# Patient Record
Sex: Female | Born: 2000 | Race: White | Hispanic: No | Marital: Single
Health system: Southern US, Community
[De-identification: ages and names within clinical notes are randomized; demographics above are authoritative.]

## PROBLEM LIST (undated history)

## (undated) DIAGNOSIS — R109 Unspecified abdominal pain: Secondary | ICD-10-CM

## (undated) DIAGNOSIS — K5900 Constipation, unspecified: Secondary | ICD-10-CM

## (undated) HISTORY — PX: MOUTH SURGERY: SHX715

## (undated) HISTORY — DX: Constipation, unspecified: K59.00

## (undated) HISTORY — DX: Unspecified abdominal pain: R10.9

---
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
## 2021-12-27 ENCOUNTER — Ambulatory Visit (HOSPITAL_BASED_OUTPATIENT_CLINIC_OR_DEPARTMENT_OTHER): Payer: Self-pay | Admitting: Nurse Practitioner

## 2022-01-30 ENCOUNTER — Ambulatory Visit (HOSPITAL_BASED_OUTPATIENT_CLINIC_OR_DEPARTMENT_OTHER): Payer: Self-pay | Admitting: Nurse Practitioner

## 2022-02-06 ENCOUNTER — Ambulatory Visit (INDEPENDENT_AMBULATORY_CARE_PROVIDER_SITE_OTHER): Payer: BC Managed Care – PPO | Admitting: Podiatry

## 2022-02-06 ENCOUNTER — Ambulatory Visit: Payer: BC Managed Care – PPO | Admitting: Podiatry

## 2022-02-06 DIAGNOSIS — L600 Ingrowing nail: Secondary | ICD-10-CM | POA: Diagnosis not present

## 2022-02-06 NOTE — Progress Notes (Signed)
   Chief Complaint  Patient presents with   Ingrown Toenail    Bilateral ingrown toe nails    Subjective: Patient presents today with her longtime boyfriend  for evaluation of pain to the medial and lateral border of the bilateral great toes. Patient is concerned for possible ingrown nail.  It is very sensitive to touch.  This has been present for several years.  She recently went to [facility] and they attempted to treat her.  She says that when she was injected with a lidocaine and anesthesia she was highly anxious and the physician asked her to leave.  She has a lot of anxiety today coming in to have her ingrown toenails evaluated.  Patient presents today for further treatment and evaluation.  Past Medical History:  Diagnosis Date   Abdominal pain    Constipation    No past surgical history on file. No Known Allergies   Objective:  General: Well developed, nourished, in no acute distress, alert and oriented x3   Dermatology: Skin is warm, dry and supple bilateral.  Medial and lateral border of the bilateral great toes appears to be erythematous with evidence of an ingrowing nail. Pain on palpation noted to the border of the nail fold. The remaining nails appear unremarkable at this time. There are no open sores, lesions.  Vascular: Dorsalis Pedis artery and Posterior Tibial artery pedal pulses palpable. No lower extremity edema noted.   Neruologic: Grossly intact via light touch bilateral.  Musculoskeletal: Muscular strength within normal limits in all groups bilateral. Normal range of motion noted to all pedal and ankle joints.   Assesement: #1 Paronychia with ingrowing nail medial and lateral border bilateral great toes #2 Pain in toe  Plan of Care:  1. Patient evaluated.  2. Discussed treatment alternatives and plan of care. Explained nail avulsion procedure and post procedure course to patient. 3. Patient opted for permanent partial nail avulsion of the ingrown  portion of the nail but she would like to have this performed at the surgery center due to high anxiety of the injections and traumatizing experience at [facility].  4.  Authorization for the procedure was initiated today to have it performed at the surgery center under sedation.  Risk benefits advantages and disadvantages were explained.  No guarantees were expressed or implied. 5.  Authorization for surgery was initiated today.  Surgery will consist partial nail matricectomy medial and lateral border of the bilateral great toes 6.  Return to clinic 1 week postop  *Chef at [employer]: Drink. Eat. Listen  Felecia Shelling, DPM [facility]  Dr. Felecia Shelling, DPM    [address] [place], [place] [address]                Office [phone]  Fax [phone]

## 2022-02-09 ENCOUNTER — Telehealth: Payer: Self-pay | Admitting: Urology

## 2022-02-09 NOTE — Telephone Encounter (Signed)
DOS - 02/16/22  PARTIAL NAIL MATRICECTOMY BILAT GREAT TOE --- 11750  [employer] EFFECTIVE DATE - 07/24/21  PLAN DEDUCTIBLE - $5,500.00 W/ $5,500.00 REMAINING OUT OF POCKET - $9,100.00 W/ $9,100.00 REMAINING COINSURANCE - 50% COPAY - $0.00   NO PRIOR AUTH REQUIRED.

## 2022-02-16 ENCOUNTER — Other Ambulatory Visit: Payer: Self-pay | Admitting: Podiatry

## 2022-02-16 DIAGNOSIS — L600 Ingrowing nail: Secondary | ICD-10-CM | POA: Diagnosis not present

## 2022-02-16 MED ORDER — DOXYCYCLINE HYCLATE 100 MG PO TABS: 100.0000 mg | ORAL_TABLET | Freq: Two times a day (BID) | ORAL | 0 refills | Status: AC

## 2022-02-16 MED ORDER — GENTAMICIN SULFATE 0.1 % EX CREA: 1.0000 | TOPICAL_CREAM | Freq: Two times a day (BID) | CUTANEOUS | 1 refills | Status: AC

## 2022-02-16 NOTE — Progress Notes (Signed)
PRN postop partial nail matrixectomy b/l hallux

## 2022-02-22 ENCOUNTER — Encounter: Payer: Self-pay | Admitting: Podiatry

## 2022-02-22 ENCOUNTER — Ambulatory Visit (INDEPENDENT_AMBULATORY_CARE_PROVIDER_SITE_OTHER): Payer: BC Managed Care – PPO | Admitting: Podiatry

## 2022-02-22 DIAGNOSIS — L600 Ingrowing nail: Secondary | ICD-10-CM

## 2022-02-22 NOTE — Progress Notes (Signed)
   Chief Complaint  Patient presents with   Follow-up    POV #1 DOS 02/16/22 --- PARIAL NAIL MATRICECTOMY MEDIAL AND LATERAL BORDERS BILAT GREAT TOES    Subjective: 21 y.o. female presents today status post permanent nail avulsion procedure of the medial and lateral border of the bilateral great toes that was performed on 02/16/2022 performed at the [facility] specialty surgery center.  Patient states that she is feeling well.  She remove the dressings 1 day after the procedure and has been soaking her feet and applying the antibiotic cream that was prescribed.  She presents for further treatment and evaluation.   Past Medical History:  Diagnosis Date   Abdominal pain    Constipation     Objective: Neurovascular status intact.  Skin is warm, dry and supple. Nail and respective nail fold appears to be healing appropriately.   Assessment: #1 s/p partial permanent nail matrixectomy medial and lateral border of the bilateral great toes   Plan of care: #1 patient was evaluated  #2 light debridement of the periungual debris was performed to the border of the respective toe and nail plate using a tissue nipper. #3 patient is to return to clinic on a PRN basis.   Felecia Shelling, DPM [facility]  Dr. Felecia Shelling, DPM    [address] [place], [place] [address]                Office [phone]  Fax [phone]

## 2022-03-01 ENCOUNTER — Encounter: Payer: BC Managed Care – PPO | Admitting: Podiatry

## 2022-03-15 ENCOUNTER — Encounter: Payer: Self-pay | Admitting: Podiatry

## 2022-03-31 ENCOUNTER — Ambulatory Visit (INDEPENDENT_AMBULATORY_CARE_PROVIDER_SITE_OTHER): Payer: BLUE CROSS/BLUE SHIELD | Admitting: Podiatry

## 2022-03-31 DIAGNOSIS — L600 Ingrowing nail: Secondary | ICD-10-CM

## 2022-03-31 NOTE — Progress Notes (Signed)
Subjective: Brooke Chaney is a 21 y.o.  female returns to office today for follow up evaluation after having bilateral Hallux Lateral border nail avulsion performed. Patient has been soaking using epsom salt and applying topical antibiotic covered with bandaid daily. Patient denies fevers, chills, nausea, vomiting. Denies any calf pain, chest pain, SOB.   Objective:  Vitals: Reviewed  General: Well developed, nourished, in no acute distress, alert and oriented x3   Dermatology: Skin is warm, dry and supple bilateral. Lateral hallux nail border appears to be clean, dry, with mild granular tissue and surrounding scab. There is no surrounding erythema, edema, drainage/purulence.  There are some granuloma noted to bilateral hallux border.  The remaining nails appear unremarkable at this time. There are no other lesions or other signs of infection present.  Neurovascular status: Intact. No lower extremity swelling; No pain with calf compression bilateral.  Musculoskeletal: Decreased tenderness to palpation of the Lateral hallux nail fold(s). Muscular strength within normal limits bilateral.   Assesement and Plan: S/p partial nail avulsion, doing well.   I encouraged her to do Betadine wet-to-dry dressing bilaterally.  There are some granuloma tissue.  If it continues to get worse we will plan on redoing the local anesthesia and removing the granuloma of the skin.  I discussed with patient she states understanding.  She is known to Dr. Logan Bores will follow back up with him  Nicholes Rough, DPM

## 2022-09-28 ENCOUNTER — Ambulatory Visit (INDEPENDENT_AMBULATORY_CARE_PROVIDER_SITE_OTHER): Payer: 59

## 2022-09-28 ENCOUNTER — Ambulatory Visit (INDEPENDENT_AMBULATORY_CARE_PROVIDER_SITE_OTHER): Payer: BLUE CROSS/BLUE SHIELD | Admitting: Orthopedic Surgery

## 2022-09-28 DIAGNOSIS — G8929 Other chronic pain: Secondary | ICD-10-CM

## 2022-09-28 DIAGNOSIS — M545 Low back pain, unspecified: Secondary | ICD-10-CM

## 2022-10-04 ENCOUNTER — Encounter: Payer: Self-pay | Admitting: Orthopedic Surgery

## 2022-10-04 NOTE — Progress Notes (Addendum)
   Office Visit Note   Patient: Brooke Chaney           Date of Birth: [birth year]           MRN: [id number] Visit Date: 09/28/2022              Requested by: No referring provider defined for this encounter. 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  She states that recently she has had some falls and 100 pound dog jumped on her back causing acute right lower back pain pain with standing patient states that she has right leg pain that goes down to the knee.  Patient states that her back pain is chronic 24 hours a day 7 days a week which she describes as a pain 4 out of 10.  Assessment & Plan: Visit Diagnoses:  1. Chronic bilateral low back pain without sciatica     Plan: Recommended strengthening and heat.  Follow-Up Instructions: Return if symptoms worsen or fail to improve.   Ortho Exam  Patient is alert, oriented, no adenopathy, well-dressed, normal affect, normal respiratory effort. Examination patient has a normal gait motor strength is 5/5 in all motor groups of both lower extremities.  There is no pain with range of motion of the hip knee or ankle she has a negative straight leg raise bilaterally.  Imaging: No results found. No images are attached to the encounter.  Labs: No results found for: "HGBA1C", "ESRSEDRATE", "CRP", "LABURIC", "REPTSTATUS", "GRAMSTAIN", "CULT", "LABORGA"   No results found for: "ALBUMIN", "PREALBUMIN", "CBC"  No results found for: "MG" No results found for: "VD25OH"  No results found for: "PREALBUMIN"     No data to display           There is no height or weight on file to calculate BMI.  Orders:  Orders Placed This Encounter  Procedures   XR Lumbar Spine 2-3 Views   No orders of the defined types were placed in this encounter.    Procedures: No procedures  performed  Clinical Data: No additional findings.  ROS:  All other systems negative, except as noted in the HPI. Review of Systems  Objective: Vital Signs: There were no vitals taken for this visit.  Specialty Comments:  No specialty comments available.  PMFS History: Patient Active Problem List   Diagnosis Date Noted   Simple constipation    Generalized abdominal pain    Past Medical History:  Diagnosis Date   Abdominal pain    Constipation     History reviewed. No pertinent family history.  History reviewed. No pertinent surgical history. Social History   Occupational History   Not on file  Tobacco Use   Smoking status: Never   Smokeless tobacco: Never  Substance and Sexual Activity   Alcohol use: Not on file   Drug use: Not on file   Sexual activity: Not on file

## 2022-10-04 NOTE — Addendum Note (Signed)
Addended by: Meridee Score on: 10/04/2022 05:00 PM   Modules accepted: Level of Service

## 2022-11-21 DIAGNOSIS — J209 Acute bronchitis, unspecified: Secondary | ICD-10-CM | POA: Diagnosis not present

## 2022-11-21 DIAGNOSIS — R059 Cough, unspecified: Secondary | ICD-10-CM | POA: Diagnosis not present

## 2022-11-21 DIAGNOSIS — J029 Acute pharyngitis, unspecified: Secondary | ICD-10-CM | POA: Diagnosis not present

## 2022-11-21 DIAGNOSIS — Z20822 Contact with and (suspected) exposure to covid-19: Secondary | ICD-10-CM | POA: Diagnosis not present

## 2022-11-24 DIAGNOSIS — J209 Acute bronchitis, unspecified: Secondary | ICD-10-CM | POA: Diagnosis not present

## 2022-12-28 DIAGNOSIS — M47816 Spondylosis without myelopathy or radiculopathy, lumbar region: Secondary | ICD-10-CM | POA: Diagnosis not present

## 2022-12-28 DIAGNOSIS — M41129 Adolescent idiopathic scoliosis, site unspecified: Secondary | ICD-10-CM | POA: Diagnosis not present

## 2023-01-11 DIAGNOSIS — N760 Acute vaginitis: Secondary | ICD-10-CM | POA: Diagnosis not present

## 2023-01-11 DIAGNOSIS — N939 Abnormal uterine and vaginal bleeding, unspecified: Secondary | ICD-10-CM | POA: Diagnosis not present

## 2023-01-11 DIAGNOSIS — N9089 Other specified noninflammatory disorders of vulva and perineum: Secondary | ICD-10-CM | POA: Diagnosis not present

## 2023-01-11 DIAGNOSIS — Z3202 Encounter for pregnancy test, result negative: Secondary | ICD-10-CM | POA: Diagnosis not present

## 2023-01-11 DIAGNOSIS — Z113 Encounter for screening for infections with a predominantly sexual mode of transmission: Secondary | ICD-10-CM | POA: Diagnosis not present

## 2023-04-24 ENCOUNTER — Other Ambulatory Visit: Payer: Self-pay | Admitting: Obstetrics & Gynecology

## 2023-04-24 DIAGNOSIS — N907 Vulvar cyst: Secondary | ICD-10-CM

## 2023-04-26 ENCOUNTER — Encounter: Payer: BLUE CROSS/BLUE SHIELD | Admitting: Nurse Practitioner

## 2023-05-08 ENCOUNTER — Other Ambulatory Visit: Payer: 59

## 2023-05-09 ENCOUNTER — Encounter (HOSPITAL_BASED_OUTPATIENT_CLINIC_OR_DEPARTMENT_OTHER): Payer: Self-pay | Admitting: Emergency Medicine

## 2023-05-09 ENCOUNTER — Emergency Department (HOSPITAL_BASED_OUTPATIENT_CLINIC_OR_DEPARTMENT_OTHER)
Admission: EM | Admit: 2023-05-09 | Discharge: 2023-05-10 | Disposition: A | Discharge status: Home / Self Care | Payer: BLUE CROSS/BLUE SHIELD | Attending: Emergency Medicine | Admitting: Emergency Medicine

## 2023-05-09 ENCOUNTER — Emergency Department (HOSPITAL_BASED_OUTPATIENT_CLINIC_OR_DEPARTMENT_OTHER): Payer: BLUE CROSS/BLUE SHIELD

## 2023-05-09 ENCOUNTER — Other Ambulatory Visit: Payer: Self-pay

## 2023-05-09 DIAGNOSIS — N751 Abscess of Bartholin's gland: Secondary | ICD-10-CM | POA: Insufficient documentation

## 2023-05-09 DIAGNOSIS — R102 Pelvic and perineal pain: Secondary | ICD-10-CM | POA: Diagnosis present

## 2023-05-09 DIAGNOSIS — N750 Cyst of Bartholin's gland: Secondary | ICD-10-CM

## 2023-05-09 LAB — BASIC METABOLIC PANEL
Anion gap: 9 (ref 5–15)
BUN: 9 mg/dL (ref 6–20)
CO2: 28 mmol/L (ref 22–32)
Calcium: 9.7 mg/dL (ref 8.9–10.3)
Chloride: 102 mmol/L (ref 98–111)
Creatinine, Ser: 0.65 mg/dL (ref 0.44–1.00)
GFR, Estimated: >60 mL/min (ref >60)
Glucose, Bld: 95 mg/dL (ref 70–99)
Potassium: 3.7 mmol/L (ref 3.5–5.1)
Sodium: 139 mmol/L (ref 135–145)

## 2023-05-09 LAB — HCG, QUANTITATIVE, PREGNANCY: hCG, Beta Chain, Quant, S: <1 m[IU]/mL (ref <5)

## 2023-05-09 LAB — CBC
HCT: 36.3 % (ref 36.0–46.0)
Hemoglobin: 12.2 g/dL (ref 12.0–15.0)
MCH: 29.4 pg (ref 26.0–34.0)
MCHC: 33.6 g/dL (ref 30.0–36.0)
MCV: 87.5 fL (ref 80.0–100.0)
Platelets: 359 10*3/uL (ref 150–400)
RBC: 4.15 MIL/uL (ref 3.87–5.11)
RDW: 14.6 % (ref 11.5–15.5)
WBC: 17.7 10*3/uL — ABNORMAL HIGH (ref 4.0–10.5)
nRBC: 0 % (ref 0.0–0.2)

## 2023-05-09 MED ORDER — OXYCODONE-ACETAMINOPHEN 5-325 MG PO TABS
1.0000 | ORAL_TABLET | ORAL | Status: DC | PRN
  Administered 2023-05-09: 1 via ORAL
  Filled 2023-05-09: qty 1

## 2023-05-09 MED ORDER — IOHEXOL 300 MG/ML  SOLN
100.0000 mL | Freq: Once | INTRAMUSCULAR | Status: AC | PRN
  Administered 2023-05-09: 80 mL via INTRAVENOUS

## 2023-05-09 MED ORDER — OXYCODONE-ACETAMINOPHEN 5-325 MG PO TABS
1.0000 | ORAL_TABLET | ORAL | Status: AC | PRN
  Administered 2023-05-09 (×2): 1 via ORAL
  Filled 2023-05-09 (×2): qty 1

## 2023-05-09 NOTE — ED Provider Notes (Signed)
[place] EMERGENCY DEPARTMENT AT [facility] Provider Note   CSN: [id number] Arrival date & time: 05/09/23  1659     History  No chief complaint on file.   Brooke Chaney is a 22 y.o. female.  Patient with noncontributory past medical history presents today with complaints of pelvic pain. She states that she has the swollen lesions in her vaginal area that have been present for several months.  She notes that there are lesions on both the left and the right sides.  The first 1 on the right showed up approximately 5 months ago and initially was not significantly painful.  The lesion on the left formed a few months after that.  She states that approximately 2 months ago these began to be painful.  They have been increasing in pain since then and over the past few weeks about come unbearably painful.  She saw her OB/GYN Dr. Langston Masker for this who did an exam and was concerned that she could have hernias causing the swelling and discomfort and sent her to have a CT scan.  She had the scan performed 2 days ago, however unfortunately she refused the IV contrast at that time due to concern for side effects and only had a CT without contrast.  The read came back recommending a MRI.  The patient's OB/GYN scheduled this MRI for Monday, however patient's pain is so severe that she presents for earlier intervention.  She denies any fevers or chills.  Denies any abdominal pain.  Denies any history of similar symptoms previously.  No nausea, vomiting, or diarrhea.  No urinary symptoms.  She does note that the lesion on the right is so swollen that it hurts to have a bowel movement in this area and she is unable to sit down or even sleep due to significant pain.  The history is provided by the patient. No language interpreter was used.       Home Medications Prior to Admission medications   Medication Sig Start Date End Date Taking? Authorizing Provider  cetirizine (ZYRTEC) 10 MG tablet Take 1  tablet (10 mg total) by mouth daily. 09/17/20   Lorelee New, PA-C  doxycycline (VIBRA-TABS) 100 MG tablet Take 1 tablet (100 mg total) by mouth 2 (two) times daily. 02/16/22   Felecia Shelling, DPM  fluticasone (FLONASE) 50 MCG/ACT nasal spray Place 2 sprays into both nostrils in the morning and at bedtime. 09/17/20   Lorelee New, PA-C  gentamicin cream (GARAMYCIN) 0.1 % Apply 1 Application topically 2 (two) times daily. 02/16/22   Felecia Shelling, DPM      Allergies    Patient has no known allergies.    Review of Systems   Review of Systems  All other systems reviewed and are negative.   Physical Exam Updated Vital Signs BP 112/71   Pulse 76   Temp 98.2 F (36.8 C) (Oral)   Resp 17   SpO2 96%  Physical Exam Vitals and nursing note reviewed. Exam conducted with a chaperone present.  Constitutional:      General: She is not in acute distress.    Appearance: Normal appearance. She is normal weight. She is not ill-appearing, toxic-appearing or diaphoretic.  HENT:     Head: Normocephalic and atraumatic.  Cardiovascular:     Rate and Rhythm: Normal rate.  Pulmonary:     Effort: Pulmonary effort is normal. No respiratory distress.  Abdominal:     General: Abdomen is flat.  Palpations: Abdomen is soft.     Tenderness: There is no abdominal tenderness.  Genitourinary:    Comments: Very large area of fluctuance in the area of the right labia/Bartholin's gland consistent with abscess.  Area is extremely tender to touch.  Unable to fully palpate the area due to swelling and pain.  No active drainage.  It does appear that there is another smaller area of fluctuance and tenderness on the left side that I am unable to fully palpate due to the significant swelling and pain on the right side.  No active drainage here.   Nonpulsatile Musculoskeletal:        General: Normal range of motion.     Cervical back: Normal range of motion.  Skin:    General: Skin is warm and dry.   Neurological:     General: No focal deficit present.     Mental Status: She is alert.  Psychiatric:        Mood and Affect: Mood normal.        Behavior: Behavior normal.     ED Results / Procedures / Treatments   Labs (all labs ordered are listed, but only abnormal results are displayed) Labs Reviewed  CBC - Abnormal; Notable for the following components:      Result Value   WBC 17.7 (*)    All other components within normal limits  BASIC METABOLIC PANEL  HCG, QUANTITATIVE, PREGNANCY    EKG None  Radiology No results found.  Procedures Procedures    Medications Ordered in ED Medications  oxyCODONE-acetaminophen (PERCOCET/ROXICET) 5-325 MG per tablet 1 tablet (1 tablet Oral Given 05/09/23 1923)  iohexol (OMNIPAQUE) 300 MG/ML solution 100 mL (80 mLs Intravenous Contrast Given 05/09/23 2156)    ED Course/ Medical Decision Making/ A&P                                 Medical Decision Making Amount and/or Complexity of Data Reviewed Labs: ordered. Radiology: ordered.  Risk Prescription drug management.   This patient is a 22 y.o. female who presents to the ED for concern of low pelvic pain, this involves an extensive number of treatment options, and is a complaint that carries with it a high risk of complications and morbidity. The emergent differential diagnosis prior to evaluation includes, but is not limited to,  abscess, hernia. This is not an exhaustive differential.   Past Medical History / Co-morbidities / Social History:  has a past medical history of Abdominal pain and Constipation.  Additional history: Chart reviewed. Pertinent results include: Patients CT wo contrast from 10/14 reveals  Hypodense structures in the bilateral labia majora, incompletely characterized in the absence of contrast. Recommend correlation with physical exam and consider MR if clinically indicated.   Physical Exam: Physical exam performed. The pertinent findings include:  Patient with swelling in the bilateral labia, concerning for bilateral labial/Bartholin's abscesses   Lab Tests: I ordered, and personally interpreted labs.  The pertinent results include:  WBC 17.7.  No other acute laboratory abnormalities.   Imaging Studies: I ordered imaging studies including CT pelvis with contrast. This study is pending at shift change.   Medications: I ordered medication including percocet  for pain. Reevaluation of the patient after these medicines showed that the patient improved. I have reviewed the patients home medicines and have made adjustments as needed.  Consultations Obtained: I requested consultation with the OB/GYN on call for [facility] [place] Dr. Lorane Gell.  I am unable to review in the patient's chart notes from her OB/GYN appointment with Dr. Langston Masker and therefore some confusion about indication for imaging.  Dr. Lorane Gell was able to read through Dr. Lilyan Punt notes and appears that Dr. Langston Masker was concerned that these areas of swelling could be hernias and that was the indication for additional imaging.   Disposition:  Patient CT pelvis with contrast is pending at shift change and will determine dispo.  On my evaluation of this imaging that is available but has not been read by radiologist, it does appear that these are consistent with Bartholin's abscesses.  However, the one on the right appears quite large and therefore I suspect will need a specialist to drain.  I strongly doubt that the patient will tolerate bedside drainage.  Plan to rediscuss with OB/GYN to determine plan for drainage likely with sedation  Care handoff to Maxwell Marion, PA-C at shift change.  Please see their note for continued evaluation and dispo.  I discussed this case with my attending physician Dr. Elayne Snare who cosigned this note including patient's presenting symptoms, physical exam, and planned diagnostics and interventions. Attending physician stated agreement with plan  or made changes to plan which were implemented.    Final Clinical Impression(s) / ED Diagnoses Final diagnoses:  None    Rx / DC Orders ED Discharge Orders     None         Silva Bandy, PA-C 05/09/23 2235    Royanne Foots, DO 05/14/23 [phone]

## 2023-05-09 NOTE — ED Notes (Signed)
Out to CT

## 2023-05-09 NOTE — ED Notes (Signed)
Pt asked to provide urine specimen, unable to do so at this time. Pt tearful about IV sts "I can feel it in my arm", iv assessed without complications. Pt does not want to be stuck again. Preg test added to blood work.

## 2023-05-09 NOTE — ED Triage Notes (Signed)
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 Pt is due for MRI next Monday but pain is so severe MD recommended her come for evaluation.

## 2023-05-10 ENCOUNTER — Encounter (HOSPITAL_BASED_OUTPATIENT_CLINIC_OR_DEPARTMENT_OTHER): Payer: Self-pay | Admitting: Obstetrics & Gynecology

## 2023-05-10 MED ORDER — ONDANSETRON 4 MG PO TBDP
8.0000 mg | ORAL_TABLET | Freq: Once | ORAL | Status: AC
  Administered 2023-05-10: 8 mg via ORAL
  Filled 2023-05-10: qty 2

## 2023-05-10 MED ORDER — SULFAMETHOXAZOLE-TRIMETHOPRIM 800-160 MG PO TABS: 1.0000 | ORAL_TABLET | Freq: Two times a day (BID) | ORAL | 0 refills | Status: AC

## 2023-05-10 MED ORDER — OXYCODONE-ACETAMINOPHEN 5-325 MG PO TABS: 1.0000 | ORAL_TABLET | Freq: Three times a day (TID) | ORAL | 0 refills | Status: AC | PRN

## 2023-05-10 MED ORDER — ONDANSETRON 8 MG PO TBDP: 8.0000 mg | ORAL_TABLET | Freq: Three times a day (TID) | ORAL | 0 refills | Status: AC | PRN

## 2023-05-10 MED ORDER — SULFAMETHOXAZOLE-TRIMETHOPRIM 800-160 MG PO TABS
1.0000 | ORAL_TABLET | Freq: Once | ORAL | Status: AC
  Administered 2023-05-10: 1 via ORAL
  Filled 2023-05-10: qty 1

## 2023-05-10 NOTE — ED Notes (Signed)
Pt stated she was unable to keep any food down, pt given chicken noodle soup to see if results with that differ.

## 2023-05-10 NOTE — Progress Notes (Signed)
Spoke w/ via phone for pre-op interview--- Brooke Chaney needs dos---- UPT per anesthesia. Surgeon orders pending.        Chaney results------ COVID test -----patient states asymptomatic no test needed Arrive at -------1115 NPO after MN NO Solid Food.  Clear liquids from MN until---1015 Med rec completed Medications to take morning of surgery -----Percocet and Zofran PRN and Bactrum Diabetic medication ----- Patient instructed no nail polish to be worn day of surgery Patient instructed to bring photo id and insurance card day of surgery Patient aware to have Driver (ride ) / caregiver    for 24 hours after surgery - Brooke Chaney Patient Special Instructions ----- Pre-Op special Instructions ----- Patient verbalized understanding of instructions that were given at this phone interview. Patient denies chest pain, sob, fever, cough at the interview.

## 2023-05-10 NOTE — Discharge Instructions (Addendum)
Take Bactrim twice a day for 7 days.  You will receive a call from your OB/GYN office tomorrow to schedule an appointment for further evaluation.  Return to ED if your symptoms worsen in the interim.

## 2023-05-10 NOTE — ED Provider Notes (Signed)
  Physical Exam  BP 112/71   Pulse 76   Temp 98.2 F (36.8 C) (Oral)   Resp 17   SpO2 96%   Physical Exam  Procedures  Procedures  ED Course / MDM    Medical Decision Making Amount and/or Complexity of Data Reviewed Labs: ordered. Radiology: ordered.  Risk Prescription drug management.   Took over patient care at shift change.  Spoke with Dr. Lorane Gell with [facility] about what the imaging showed, without the radiology read.  No suspicion for hernia.  Most likely bilateral abscesses.   Dr. Lorane Gell states if she is stable, patient can be discharged home with Bactrim or Augmentin.  They will call her tomorrow to schedule an appointment for patient to follow-up and possibly schedule for patient to go to the OR for I&D or do it in office.  Patient started crying when I mentioned in office I&D.  She stated that she did not want to be awake for it. She was given first dose of Bactrim in the ED tonight. Prescription sent to the pharmacy.  Patient is hemodynamically stable.  Prescription of Percocet sent to the pharmacy for pain.  Imaging pending at shift change. Care transferred to Dr. Bebe Shaggy.   Maxwell Marion, PA-C 05/10/23 0050    Zadie Rhine, MD 05/10/23 [phone]

## 2023-05-11 ENCOUNTER — Ambulatory Visit (HOSPITAL_BASED_OUTPATIENT_CLINIC_OR_DEPARTMENT_OTHER)
Admission: RE | Admit: 2023-05-11 | Payer: BLUE CROSS/BLUE SHIELD | Source: Home / Self Care | Admitting: Obstetrics & Gynecology

## 2023-05-11 DIAGNOSIS — Z01818 Encounter for other preprocedural examination: Secondary | ICD-10-CM

## 2023-05-11 SURGERY — MARSUPIALIZATION, CYST, BARTHOLIN'S GLAND
Anesthesia: Choice | Laterality: Bilateral

## 2023-09-17 ENCOUNTER — Other Ambulatory Visit: Payer: Self-pay

## 2023-09-17 ENCOUNTER — Emergency Department (HOSPITAL_COMMUNITY): Payer: Self-pay

## 2023-09-17 ENCOUNTER — Emergency Department (HOSPITAL_COMMUNITY)
Admission: EM | Admit: 2023-09-17 | Discharge: 2023-09-18 | Disposition: A | Discharge status: Home / Self Care | Payer: Self-pay | Attending: Emergency Medicine | Admitting: Emergency Medicine

## 2023-09-17 DIAGNOSIS — M25532 Pain in left wrist: Secondary | ICD-10-CM | POA: Diagnosis not present

## 2023-09-17 DIAGNOSIS — M25512 Pain in left shoulder: Secondary | ICD-10-CM | POA: Insufficient documentation

## 2023-09-17 DIAGNOSIS — M542 Cervicalgia: Secondary | ICD-10-CM | POA: Diagnosis present

## 2023-09-17 DIAGNOSIS — M25522 Pain in left elbow: Secondary | ICD-10-CM | POA: Insufficient documentation

## 2023-09-17 DIAGNOSIS — Y92410 Unspecified street and highway as the place of occurrence of the external cause: Secondary | ICD-10-CM | POA: Diagnosis not present

## 2023-09-17 DIAGNOSIS — F419 Anxiety disorder, unspecified: Secondary | ICD-10-CM | POA: Diagnosis not present

## 2023-09-17 LAB — BASIC METABOLIC PANEL
Anion gap: 11 (ref 5–15)
BUN: 10 mg/dL (ref 6–20)
CO2: 20 mmol/L — ABNORMAL LOW (ref 22–32)
Calcium: 9.4 mg/dL (ref 8.9–10.3)
Chloride: 107 mmol/L (ref 98–111)
Creatinine, Ser: 0.67 mg/dL (ref 0.44–1.00)
GFR, Estimated: >60 mL/min (ref >60)
Glucose, Bld: 114 mg/dL — ABNORMAL HIGH (ref 70–99)
Potassium: 3.8 mmol/L (ref 3.5–5.1)
Sodium: 138 mmol/L (ref 135–145)

## 2023-09-17 LAB — CBC
HCT: 38.5 % (ref 36.0–46.0)
Hemoglobin: 12.6 g/dL (ref 12.0–15.0)
MCH: 28.8 pg (ref 26.0–34.0)
MCHC: 32.7 g/dL (ref 30.0–36.0)
MCV: 88.1 fL (ref 80.0–100.0)
Platelets: 250 10*3/uL (ref 150–400)
RBC: 4.37 MIL/uL (ref 3.87–5.11)
RDW: 15.2 % (ref 11.5–15.5)
WBC: 6.6 10*3/uL (ref 4.0–10.5)
nRBC: 0 % (ref 0.0–0.2)

## 2023-09-17 MED ORDER — IBUPROFEN 400 MG PO TABS
600.0000 mg | ORAL_TABLET | Freq: Once | ORAL | Status: AC
  Administered 2023-09-17: 600 mg via ORAL
  Filled 2023-09-17: qty 1
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
## 2023-09-18 NOTE — ED Notes (Signed)
 Pt denies numbness to extremities at this time. Pt alert in oriented x4, in no acute distress.

## 2024-01-18 ENCOUNTER — Emergency Department (HOSPITAL_BASED_OUTPATIENT_CLINIC_OR_DEPARTMENT_OTHER)
Admission: EM | Admit: 2024-01-18 | Discharge: 2024-01-19 | Disposition: A | Discharge status: Home / Self Care | Attending: Emergency Medicine | Admitting: Emergency Medicine

## 2024-01-18 ENCOUNTER — Encounter (HOSPITAL_BASED_OUTPATIENT_CLINIC_OR_DEPARTMENT_OTHER): Payer: Self-pay | Admitting: Emergency Medicine

## 2024-01-18 ENCOUNTER — Other Ambulatory Visit: Payer: Self-pay

## 2024-01-18 DIAGNOSIS — X102XXA Contact with fats and cooking oils, initial encounter: Secondary | ICD-10-CM | POA: Insufficient documentation

## 2024-01-18 DIAGNOSIS — T23161A Burn of first degree of back of right hand, initial encounter: Secondary | ICD-10-CM | POA: Insufficient documentation

## 2024-01-18 DIAGNOSIS — T300 Burn of unspecified body region, unspecified degree: Secondary | ICD-10-CM

## 2024-01-18 DIAGNOSIS — T23201A Burn of second degree of right hand, unspecified site, initial encounter: Secondary | ICD-10-CM | POA: Diagnosis not present

## 2024-01-18 DIAGNOSIS — T310 Burns involving less than 10% of body surface: Secondary | ICD-10-CM | POA: Diagnosis not present

## 2024-01-18 MED ORDER — OXYCODONE-ACETAMINOPHEN 5-325 MG PO TABS
1.0000 | ORAL_TABLET | ORAL | Status: DC | PRN
  Administered 2024-01-18: 1 via ORAL
  Filled 2024-01-18: qty 1
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
## 2024-01-19 LAB — CBC WITH DIFFERENTIAL/PLATELET
Abs Immature Granulocytes: 0.02 10*3/uL (ref 0.00–0.07)
Basophils Absolute: 0 10*3/uL (ref 0.0–0.1)
Basophils Relative: 0 %
Eosinophils Absolute: 0.1 10*3/uL (ref 0.0–0.5)
Eosinophils Relative: 1 %
HCT: 38.9 % (ref 36.0–46.0)
Hemoglobin: 13 g/dL (ref 12.0–15.0)
Immature Granulocytes: 0 %
Lymphocytes Relative: 47 %
Lymphs Abs: 3.7 10*3/uL (ref 0.7–4.0)
MCH: 28.8 pg (ref 26.0–34.0)
MCHC: 33.4 g/dL (ref 30.0–36.0)
MCV: 86.3 fL (ref 80.0–100.0)
Monocytes Absolute: 0.5 10*3/uL (ref 0.1–1.0)
Monocytes Relative: 6 %
Neutro Abs: 3.7 10*3/uL (ref 1.7–7.7)
Neutrophils Relative %: 46 %
Platelets: 230 10*3/uL (ref 150–400)
RBC: 4.51 MIL/uL (ref 3.87–5.11)
RDW: 15.3 % (ref 11.5–15.5)
WBC: 8 10*3/uL (ref 4.0–10.5)
nRBC: 0 % (ref 0.0–0.2)

## 2024-01-19 LAB — COMPREHENSIVE METABOLIC PANEL WITH GFR
ALT: 21 U/L (ref 0–44)
AST: 25 U/L (ref 15–41)
Albumin: 4.7 g/dL (ref 3.5–5.0)
Alkaline Phosphatase: 74 U/L (ref 38–126)
Anion gap: 11 (ref 5–15)
BUN: 9 mg/dL (ref 6–20)
CO2: 24 mmol/L (ref 22–32)
Calcium: 9.9 mg/dL (ref 8.9–10.3)
Chloride: 104 mmol/L (ref 98–111)
Creatinine, Ser: 0.73 mg/dL (ref 0.44–1.00)
GFR, Estimated: >60 mL/min (ref >60)
Glucose, Bld: 86 mg/dL (ref 70–99)
Potassium: 3.8 mmol/L (ref 3.5–5.1)
Sodium: 139 mmol/L (ref 135–145)
Total Bilirubin: <0.2 mg/dL (ref 0.0–1.2)
Total Protein: 7.6 g/dL (ref 6.5–8.1)

## 2024-01-19 LAB — HCG, SERUM, QUALITATIVE: Preg, Serum: NEGATIVE

## 2024-01-19 MED ORDER — BACITRACIN 500 UNIT/GM EX OINT: 1.0000 | TOPICAL_OINTMENT | Freq: Two times a day (BID) | CUTANEOUS | 0 refills | Status: AC

## 2024-01-19 MED ORDER — HYDROCODONE-ACETAMINOPHEN 5-325 MG PO TABS
1.0000 | ORAL_TABLET | ORAL | 0 refills | Status: AC | PRN
Start: 2024-01-19 — End: ?

## 2024-05-05 ENCOUNTER — Ambulatory Visit: Payer: Self-pay | Admitting: Family Medicine

## 2024-05-07 ENCOUNTER — Ambulatory Visit: Payer: Self-pay | Admitting: Family Medicine

## 2024-05-20 DIAGNOSIS — H52223 Regular astigmatism, bilateral: Secondary | ICD-10-CM | POA: Diagnosis not present

## 2025-06-05 ENCOUNTER — Ambulatory Visit: Admitting: Family Medicine
